# Patient Record
Sex: Female | Born: 1985 | Race: Black or African American | Hispanic: No | Marital: Single | Smoking: Current every day smoker
Health system: Southern US, Community
[De-identification: ages and names within clinical notes are randomized; demographics above are authoritative.]

## PROBLEM LIST (undated history)

## (undated) HISTORY — PX: ABDOMINAL HYSTERECTOMY: SHX81

---
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
## 2005-08-21 ENCOUNTER — Emergency Department (HOSPITAL_COMMUNITY): Admission: EM | Admit: 2005-08-21 | Discharge: 2005-08-21 | Payer: Self-pay | Admitting: Emergency Medicine

## 2005-09-16 ENCOUNTER — Emergency Department (HOSPITAL_COMMUNITY): Admission: EM | Admit: 2005-09-16 | Discharge: 2005-09-16 | Payer: Self-pay | Admitting: Emergency Medicine

## 2006-02-15 ENCOUNTER — Emergency Department (HOSPITAL_COMMUNITY): Admission: EM | Admit: 2006-02-15 | Discharge: 2006-02-16 | Payer: Self-pay | Admitting: Emergency Medicine

## 2006-06-27 ENCOUNTER — Encounter (INDEPENDENT_AMBULATORY_CARE_PROVIDER_SITE_OTHER): Payer: Self-pay | Admitting: Gynecology

## 2006-06-27 ENCOUNTER — Inpatient Hospital Stay (HOSPITAL_COMMUNITY): Admission: AD | Admit: 2006-06-27 | Discharge: 2006-06-30 | Payer: Self-pay | Admitting: Gynecology

## 2006-06-27 ENCOUNTER — Ambulatory Visit: Payer: Self-pay | Admitting: Gynecology

## 2006-07-31 ENCOUNTER — Inpatient Hospital Stay (HOSPITAL_COMMUNITY): Admission: AD | Admit: 2006-07-31 | Discharge: 2006-08-01 | Payer: Self-pay | Admitting: Obstetrics & Gynecology

## 2006-07-31 ENCOUNTER — Ambulatory Visit: Payer: Self-pay | Admitting: Obstetrics and Gynecology

## 2010-03-23 ENCOUNTER — Emergency Department (HOSPITAL_COMMUNITY)
Admission: EM | Admit: 2010-03-23 | Discharge: 2010-03-23 | Disposition: A | Discharge status: Home / Self Care | Payer: Self-pay | Attending: Emergency Medicine | Admitting: Emergency Medicine

## 2010-03-23 ENCOUNTER — Emergency Department (HOSPITAL_COMMUNITY): Payer: Self-pay

## 2010-03-23 DIAGNOSIS — R0789 Other chest pain: Secondary | ICD-10-CM | POA: Insufficient documentation

## 2010-03-23 LAB — APTT: aPTT: 31 seconds (ref 24–37)

## 2010-03-23 LAB — DIFFERENTIAL
Basophils Relative: 0 % (ref 0–1)
Eosinophils Absolute: 0.1 10*3/uL (ref 0.0–0.7)
Neutrophils Relative %: 72 % (ref 43–77)

## 2010-03-23 LAB — CBC
MCH: 29.3 pg (ref 26.0–34.0)
Platelets: 256 10*3/uL (ref 150–400)
RBC: 3.72 MIL/uL — ABNORMAL LOW (ref 3.87–5.11)
WBC: 5.7 10*3/uL (ref 4.0–10.5)

## 2010-03-23 LAB — POCT CARDIAC MARKERS
CKMB, poc: <1 ng/mL — ABNORMAL LOW (ref 1.0–8.0)
Myoglobin, poc: 58.1 ng/mL (ref 12–200)

## 2010-03-23 LAB — PROTIME-INR: Prothrombin Time: 13 seconds (ref 11.6–15.2)

## 2010-03-23 LAB — D-DIMER, QUANTITATIVE: D-Dimer, Quant: 3.21 ug/mL-FEU — ABNORMAL HIGH (ref 0.00–0.48)

## 2010-03-23 LAB — BASIC METABOLIC PANEL
CO2: 24 mEq/L (ref 19–32)
Calcium: 8.8 mg/dL (ref 8.4–10.5)
Creatinine, Ser: 0.56 mg/dL (ref 0.4–1.2)
GFR calc Af Amer: >60 mL/min (ref >60)

## 2010-03-23 MED ORDER — IOHEXOL 350 MG/ML SOLN
60.0000 mL | Freq: Once | INTRAVENOUS | Status: AC | PRN
  Administered 2010-03-23: 60 mL via INTRAVENOUS

## 2010-06-04 NOTE — Discharge Summary (Signed)
Sara Mckinney, Sara Mckinney              ACCOUNT NO.:  [id number]   MEDICAL RECORD NO.:  [id number]          PATIENT TYPE:  INP   LOCATION:  9303                          FACILITY:  [facility]   PHYSICIAN:  [name], M.D. DATE OF BIRTH:  [birth year]   DATE OF ADMISSION:  07/31/2006  DATE OF DISCHARGE:  07/12/2008LH                               DISCHARGE SUMMARY   ADMISSION DIAGNOSES:  Right upper quadrant pain, epigastric pain, rule  out biliary colic.   DISCHARGE DIAGNOSES:  Right upper quadrant pain, epigastric pain,  resolved. No evidence of biliary colic. Presumed gastroenteritis,  improved.   HISTORY OF PRESENT ILLNESS:  This 25 year old Gravida 3, para 2, status  post cesarean and hysterectomy 1 month ago was admitted with dramatic  presentation of acute vomiting beginning at 4:00 p.m. on July 31, 2006  with rising discomfort and bilious vomiting. There was question of a  positive Murphy's sign and due to severity of her pain, she was admitted  for analgesics and had evaluation. Temperature is 98.3. Overnight, the  patient improved on IV antibiotic Ancef plus fluid hydration. The  following day, gallbladder ultrasound was negative and the patient felt  tremendously better with only mild to minimal right upper quadrant pain.  Therefore, discharged the patient home on routine followup with  physician of her choice.      [name], M.D.  Electronically Signed     JVF/MEDQ  D:  08/01/2006  T:  08/02/2006  Job:  [id number]

## 2010-06-04 NOTE — Discharge Summary (Signed)
NAMESABRE, Mckinney              ACCOUNT NO.:  [id number]   MEDICAL RECORD NO.:  [id number]          PATIENT TYPE:  INP   LOCATION:  9315                          FACILITY:  [facility]   PHYSICIAN:  Ginger Carne, MD  DATE OF BIRTH:  [birth year]   DATE OF ADMISSION:  06/27/2006  DATE OF DISCHARGE:  06/30/2006                               DISCHARGE SUMMARY   ADMISSION DIAGNOSIS:  Approximately 29 weeks intrauterine pregnancy,  abruptio placenta.  No prenatal care, hypertension.   DISCHARGE DIAGNOSIS:  Primary low transverse C-section with a complete  abdominal hysterectomy, no prenatal care, 29-week viable female baby,  abruptio placenta, acute blood loss anemia requiring transfusion of 4  units, probable chronic hypertension with superimposed preeclampsia  versus hypertension due to cocaine abuse.   HISTORY:  The patient is a 25 year old G3, P 1-0-1-1 who presented with  estimated gestational age of approximately 36 weeks by LMP and no  prenatal care with vaginal bleeding for the past 2 hours prior to  admission which was described as menstrual like with clots. She was  having mild contractions every 3 minutes. These were worsening. She  denied continuous abdominal pain, she denied any known pregnancy  complications. Pregnancy history is significant for a term vaginal  delivery without complications and SAB that was early with D&C.   PAST MEDICAL HISTORY:  Noncontributory.   SOCIAL HISTORY:  On admission she denied smoking, drinking and substance  abuse. She worked as a Engineer, water.   ALLERGIES:  CODEINE causes hives.   MEDICATIONS:  Were only NyQuil.   REVIEW OF SYSTEMS:  Was negative other than the vaginal bleeding. She  denied leakage of fluid and did have fetal movement.   ADMISSION LABS:  Prenatal panel was significant for Rh positive blood  and rubella immune and an initial hemoglobin of 12.  Her glucose was  elevated at 157, however this value was after she  had received some IV  fluids. Follow-up glucose was 132 and her DIC labs were significant for  a fibrinogen that was fairly low at 324. Her UDS was negative for  cocaine but positive for opiates. She denied headache, visual  disturbance or abdominal pain.   ADMISSION PHYSICAL EXAM:  VITAL SIGNS:  Significant for BP in the  140/106 range, her highest noted was 169/119. Her bedside ultrasound by  Dr. Mia Creek revealed BPP of 2 out of 10 and an approximate gestational  age of [redacted] weeks. Radiology department also did do an ultrasound  confirming the abruption. O2 sat was normal.  She was afebrile.  GENERAL:  She was well-developed and well-nourished in no acute  distress. Normocephalic.  ENT: Normal.  LUNGS: Clear without adventitious sounds.  HEART:  RRR without murmur.  ABDOMEN:  Soft, nontender and tender and gravid.  PELVIC:  Her cervical exam was 4 cm dilated, 80%, -1, cephalic.  EXTREMITIES: Without edema.   HOSPITAL COURSE:  Due to the heavy bleeding with diagnosis of placental  abruption as well as ultrasound that was nonreassuring for fetal well-  being.  The patient went right to surgery where she  had a primary LTCS  and had intractable bleeding which required a hysterectomy. She had  spinal anesthesia.  The EBL was 1500.  She did also have a complication  of uterine atony. The placenta was noted to be fundal, there was one  nuchal cord.  The uterus had significant amount of clots.  Postoperatively the patient received 4 units of PRBC and this increased  her hematocrit from 7.7 to 13.1.  Post transfusion she did well with her  blood pressures remaining in the 140 over 90s to 100s range, there was  isolated 160/112. On day of discharge BP is were 121-141 over 89-96. Her  infant was doing well in NICU.  Her vaginal bleeding had been tapering,  she had no or orthostatic symptoms.  She had minimal abdominal  tenderness and pain.  No preeclampsia symptoms. Her exam was normal  other  than few crackles at right lower base. The incision was clean, dry  and intact with staples. She had no hyperreflexia or dependent edema. On  postoperative day #3 she was discharged home with staple removal, Steri-  Strips applied.   DISCHARGE MEDICATIONS:  1. Prenatal vitamin one a day.  2. Labetalol 400 mg one b.i.d.  3. Percocet 5/325 1-2 every 4-6 hours as needed.  She was given 40      with no refills.  4. She also given Motrin 600 one p.o. q.6 hours for pain.  5. Colace 100 mg one b.i.d. p.r.n.   She was to have lactation consult and was pumping breasts well at time  of discharge. She also did have a social service consult and they would  be following the preterm baby. She was given an appointment for follow-  up and blood pressure check on June 26, at GYN clinic and discharged  home in good condition.      Sara Mckinney, C.N.M.      Ginger Carne, MD  Electronically Signed    DP/MEDQ  D:  06/30/2006  T:  06/30/2006  Job:  [id number]

## 2010-06-04 NOTE — H&P (Signed)
Sara Mckinney, Sara Mckinney              ACCOUNT NO.:  [id number]   MEDICAL RECORD NO.:  [id number]          PATIENT TYPE:  INP   LOCATION:  9303                          FACILITY:  [facility]   PHYSICIAN:  [name], M.D. DATE OF BIRTH:  02/23/1985   DATE OF ADMISSION:  07/31/2006  DATE OF DISCHARGE:  08/01/2006                              HISTORY & PHYSICAL   ADMITTING DIAGNOSES:  1. Acute abdominal pain x6 hours, rule out biliary colic.  2. Postoperative 4 weeks status post cesarean hysterectomy.   HISTORY OF PRESENT ILLNESS:  This 25 year old female gravida 2, para 2,  now 4 weeks status post cesarean hysterectomy presents with a 4 to 6-  hour exacerbation  of acute epigastric abdominal pain and right upper  quadrant pain associated with vomiting which is persistent without any  associated fevers or chills.  The patient is writhing in bed without the  ability to obtain a comfortable position with bilious vomiting noted.  She has been alert, oriented x3 and in overall good health since  cesarean hysterectomy.   PAST MEDICAL HISTORY:  Negative except for currently hypertensive being  treated.   SURGICAL HISTORY:  1. D&C in the past for a miscarriage.  2. C-section x1 with an associated hysterectomy secondary to uterine      atony.   ALLERGIES:  CODEINE.   Cigarettes, alcohol, recreational drugs:  Denied.   PHYSICAL EXAMINATION:  GENERAL:  Shows a healthy-appearing African  American female in moderate to marked discomfort writhing in bed,  rocking back and forth and rubbing her abdomen, not able to find a  comfortable position.  Alert, oriented x3.  EYES:  Pupils are equal, round, and reactive.  NECK:  Supple.  CARDIOVASCULAR:  Normal.  LUNGS:  Clear.  ABDOMEN:  Bowel sounds hypoactive.  Scaphoid abdomen.  Upper abdomen is  tender in the epigastric area and the right upper quadrant.  Eulah Pont sign  is equivocal and interpreted as positive with exam limited by the  patient's  inability to be still.  EXTERNAL GENITALIA:  Normal.  Vaginal exam deferred at this time.   LABORATORY:  Pending urinalysis, flat and upright abdomen, CBC, liver  panel.   PLAN:  Admit for pain management.  Gallbladder ultrasound in the a.m.  and probable surgical consult or transfer depending on results of  gallbladder ultrasound.  NPO status.  IV fluid hydration and IV  analgesics.      [name], M.D.  Electronically Signed     JVF/MEDQ  D:  07/31/2006  T:  08/02/2006  Job:  [id number]   cc:   [facility] G[facility]

## 2010-06-04 NOTE — Op Note (Signed)
NAMESHARMEL, BALLANTINE              ACCOUNT NO.:  [id number]   MEDICAL RECORD NO.:  [id number]          PATIENT TYPE:  MAT   LOCATION:  MATC                          [facility]ACILITY:  [facility]   PHYSICIAN:  Ginger Carne, MD  DATE OF BIRTH:  [birth year]   DATE OF PROCEDURE:  06/27/2006  DATE OF DISCHARGE:                               OPERATIVE REPORT   PREOPERATIVE DIAGNOSIS:  29-week abruptio placenta with heavy vaginal  bleeding.   POSTOPERATIVE DIAGNOSIS:  29-week abruptio placenta with heavy vaginal  bleeding, preterm viable delivery of female infant.   PROCEDURE:  Primary cesarean hysterectomy.   SURGEON:  Ginger Carne, MD   ASSISTANT:  None.   COMPLICATIONS:  None immediate.   ESTIMATED BLOOD LOSS:  1500 mL.   ANESTHESIA:  Spinal.   SPECIMEN:  Cord pH (6.96) cord bloods and placenta to pathology.   OPERATIVE FINDINGS:  A female infant in vertex presentation, 3+ clots in  the uterine cavity 75% abruptio on maternal side evidenced by attached  clots to said surface.  Anterofundal placenta.  Nuchal cord times one.  No gross abnormalities.  The baby cried spontaneously at delivery.  Placenta was complete, three-vessel cord central insertion.  Uterus,  tubes and ovaries showed normal decidual changes of pregnancy.  NICU  present.   OPERATIVE PROCEDURE:  The patient prepped and draped in usual fashion  and placed in the left lateral supine position.  Betadine solution used  for antiseptic.  The patient catheterized prior to procedure.  After  adequate spinal analgesia, a Pfannenstiel incision was made.  The  abdomen opened.  Lower uterine segment incised transversely.  Baby  delivered, cord clamped and cut and infant given to the pediatric staff  after bulb suctioning.  Placenta removed manually.  Uterus inspected.  Closure of the uterus in one layer of 0-0 Vicryl running interlocking  suture.   Uterine atony had occurred following said closure of uterine scar.  Vigorous  massage with 40 units of Pitocin per liter were placed in the  intravenous solution and run at a full open stream.  The patient was  hypertensive and not a candidate for Methergine.  Hemabate 250 mcg  injected in the fundus of the uterus and massaged.  At that time an  emergency was called into another room and our circulating nurse had to  leave.  This prevented the immediate access to either Cytotec and/or  Keith needle with appropriate sutures to perform a meatloaf circular  suturing of the uterus proper.  For this reason a cesarean hysterectomy  was performed.  The patient had also indicated she had no desire for  further child bearing.   The round ligaments were clamped, cut and ligated with 0-0 Vicryl suture  and divided.  Afterwards the utero-ovarian ligaments were clamped and  ligated with 0-0 Vicryl suture.  The anterior posterior peritoneal  reflections were dissected off the uterus and in a standard Richardson  fashion, the uterine vasculature was clamped and ligated with 0-0 Vicryl  suture.  At the junction of cervix and vagina, the corpus and cervix  were removed,  cuff closed with 0-0 Vicryl running interrupted sutures.  Bleeding points hemostatically checked.  Blood clots removed.  Closure  of the fascia in one layer of 0-0 Vicryl running suture and skin staples  for the skin.  Instrument and sponge count were correct.  The patient  tolerated the procedure well, returned to post anesthesia recovery room  in excellent condition.      Ginger Carne, MD  Electronically Signed     SHB/MEDQ  D:  06/27/2006  T:  06/27/2006  Job:  [id number]

## 2010-06-07 NOTE — Op Note (Signed)
   NAME:  Sara Mckinney, Sara Mckinney                        ACCOUNT NO.:  [id number]   MEDICAL RECORD NO.:  [id number]                   PATIENT TYPE:  MAT   LOCATION:  MATC                                 [facility]ACILITY:  [facility]   PHYSICIAN:  Charles A. Clearance Coots, M.D.             DATE OF BIRTH:  08/19/1985   DATE OF PROCEDURE:  01/07/2002  DATE OF DISCHARGE:                                 OPERATIVE REPORT   PREOPERATIVE DIAGNOSES:  Incomplete spontaneous abortion, first trimester.   POSTOPERATIVE DIAGNOSES:  Incomplete spontaneous abortion, first trimester.   PROCEDURE:  Suction evacuation.   SURGEON:  Charles A. Clearance Coots, M.D.   ANESTHESIA:  MAC with paracervical block.   ESTIMATED BLOOD LOSS:  100 mL.   COMPLICATIONS:  None.   SPECIMENS:  Products of conception.   OPERATION:  The patient was brought to the operating room, and after  satisfactory IV sedation, the legs were brought up in stirrups, and the  vagina was prepped and draped in the usual sterile fashion.  The urinary  bladder was emptied of approximately 100 mL of clear urine.  Bimanual  examination revealed the uterus to be in midposition, approximately 6-8  weeks' size.  A sterile speculum was inserted in the vaginal vault, and the  cervix was isolated.  The anterior lip of the cervix was grasped with a  double-toothed tenaculum.  A paracervical block of approximately 20 mL total  of 2% Xylocaine mixed with 2 mL of sodium bicarbonate was injected in each  lateral fornix, approximately 10 mL in each lateral fornix.  The cervix was  noted to be open, and products of conception were noted in the cervical os.  A #7 suction cath was easily introduced into the uterine cavity, and all  contents were evacuated.  The uterus contracted down quite well, and the  endometrial surface felt quite gritty at the conclusion of the procedure.  There was no active bleeding noted.  All instruments were retired.  The  patient tolerated the procedure well  and was transported to the recovery  room in satisfactory condition.                                               Charles A. Clearance Coots, M.D.    CAH/MEDQ  D:  01/07/2002  T:  01/08/2002  Job:  [id number]

## 2010-06-22 ENCOUNTER — Emergency Department (HOSPITAL_COMMUNITY)
Admission: EM | Admit: 2010-06-22 | Discharge: 2010-06-23 | Disposition: A | Discharge status: Home / Self Care | Payer: Self-pay | Attending: Emergency Medicine | Admitting: Emergency Medicine

## 2010-06-22 DIAGNOSIS — R079 Chest pain, unspecified: Secondary | ICD-10-CM | POA: Insufficient documentation

## 2010-06-22 DIAGNOSIS — R0989 Other specified symptoms and signs involving the circulatory and respiratory systems: Secondary | ICD-10-CM | POA: Insufficient documentation

## 2010-06-22 DIAGNOSIS — N390 Urinary tract infection, site not specified: Secondary | ICD-10-CM | POA: Insufficient documentation

## 2010-06-22 DIAGNOSIS — D649 Anemia, unspecified: Secondary | ICD-10-CM | POA: Insufficient documentation

## 2010-06-22 DIAGNOSIS — R0609 Other forms of dyspnea: Secondary | ICD-10-CM | POA: Insufficient documentation

## 2010-06-22 DIAGNOSIS — E876 Hypokalemia: Secondary | ICD-10-CM | POA: Insufficient documentation

## 2010-06-23 ENCOUNTER — Emergency Department (HOSPITAL_COMMUNITY): Payer: Self-pay

## 2010-06-23 LAB — DIFFERENTIAL
Basophils Absolute: 0 10*3/uL (ref 0.0–0.1)
Basophils Relative: 0 % (ref 0–1)
Eosinophils Absolute: 0.1 10*3/uL (ref 0.0–0.7)
Eosinophils Relative: 1 % (ref 0–5)
Lymphocytes Relative: 18 % (ref 12–46)

## 2010-06-23 LAB — URINALYSIS, ROUTINE W REFLEX MICROSCOPIC
Ketones, ur: >80 mg/dL — AB
Nitrite: POSITIVE — AB
Protein, ur: NEGATIVE mg/dL

## 2010-06-23 LAB — CBC
Platelets: 326 10*3/uL (ref 150–400)
RDW: 12.7 % (ref 11.5–15.5)
WBC: 8.2 10*3/uL (ref 4.0–10.5)

## 2010-06-23 LAB — POCT PREGNANCY, URINE: Preg Test, Ur: NEGATIVE

## 2010-06-23 LAB — RAPID URINE DRUG SCREEN, HOSP PERFORMED
Amphetamines: NOT DETECTED
Barbiturates: NOT DETECTED
Cocaine: NOT DETECTED
Opiates: NOT DETECTED
Tetrahydrocannabinol: POSITIVE — AB

## 2010-06-23 LAB — POCT I-STAT, CHEM 8
Calcium, Ion: 1.11 mmol/L — ABNORMAL LOW (ref 1.12–1.32)
Glucose, Bld: 84 mg/dL (ref 70–99)
HCT: 29 % — ABNORMAL LOW (ref 36.0–46.0)
Hemoglobin: 9.9 g/dL — ABNORMAL LOW (ref 12.0–15.0)
Potassium: 3.2 mEq/L — ABNORMAL LOW (ref 3.5–5.1)
TCO2: 22 mmol/L (ref 0–100)

## 2010-06-23 LAB — OCCULT BLOOD, POC DEVICE: Fecal Occult Bld: NEGATIVE

## 2010-11-05 LAB — URINE MICROSCOPIC-ADD ON: RBC / HPF: NONE SEEN

## 2010-11-05 LAB — COMPREHENSIVE METABOLIC PANEL
ALT: 19
CO2: 22
Calcium: 10
Creatinine, Ser: 0.6
GFR calc Af Amer: >60
GFR calc non Af Amer: >60
Glucose, Bld: 142 — ABNORMAL HIGH
Sodium: 133 — ABNORMAL LOW
Total Protein: 9.2 — ABNORMAL HIGH

## 2010-11-05 LAB — DIFFERENTIAL
Basophils Absolute: 0
Basophils Relative: 0
Monocytes Absolute: 0.4
Neutro Abs: 9.6 — ABNORMAL HIGH

## 2010-11-05 LAB — URINALYSIS, ROUTINE W REFLEX MICROSCOPIC
Bilirubin Urine: NEGATIVE
Glucose, UA: NEGATIVE
Hgb urine dipstick: NEGATIVE
Ketones, ur: >80 — AB
Nitrite: NEGATIVE
Protein, ur: 30 — AB
Specific Gravity, Urine: 1.02
Urobilinogen, UA: 0.2
pH: 7

## 2010-11-05 LAB — HEPATIC FUNCTION PANEL
ALT: 18
AST: 21
Albumin: 4
Alkaline Phosphatase: 98
Total Bilirubin: 1.1

## 2010-11-05 LAB — CBC
Hemoglobin: 12.7
MCHC: 34.7
MCV: 88.1
RDW: 14

## 2010-11-05 LAB — HEPATITIS PANEL, ACUTE: Hepatitis B Surface Ag: NEGATIVE

## 2010-11-07 LAB — DIC (DISSEMINATED INTRAVASCULAR COAGULATION)PANEL
D-Dimer, Quant: >20 — ABNORMAL HIGH
Platelets: 208
Prothrombin Time: 12.2

## 2010-11-07 LAB — BASIC METABOLIC PANEL
BUN: 3 — ABNORMAL LOW
BUN: 4 — ABNORMAL LOW
Chloride: 103
GFR calc Af Amer: >60
GFR calc non Af Amer: >60
Potassium: 3.5
Potassium: 4.1
Sodium: 133 — ABNORMAL LOW

## 2010-11-07 LAB — TYPE AND SCREEN: ABO/RH(D): B POS

## 2010-11-07 LAB — RAPID URINE DRUG SCREEN, HOSP PERFORMED
Amphetamines: NOT DETECTED
Amphetamines: NOT DETECTED
Benzodiazepines: NOT DETECTED
Cocaine: NOT DETECTED
Opiates: POSITIVE — AB
Tetrahydrocannabinol: NOT DETECTED
Tetrahydrocannabinol: NOT DETECTED

## 2010-11-07 LAB — DIFFERENTIAL
Basophils Absolute: 0
Lymphocytes Relative: 19
Lymphs Abs: 1.5
Monocytes Absolute: 0.8 — ABNORMAL HIGH
Monocytes Relative: 11
Neutro Abs: 5.4

## 2010-11-07 LAB — CBC
HCT: 38.1
HCT: 39.6
Hemoglobin: 12
Hemoglobin: 13.1
MCV: 88.7
Platelets: 127 — ABNORMAL LOW
RBC: 3.65 — ABNORMAL LOW
RBC: 4.3
WBC: 10.7 — ABNORMAL HIGH
WBC: 7.8
WBC: 8.6

## 2010-11-07 LAB — RUBELLA SCREEN: Rubella: 221.7 — ABNORMAL HIGH

## 2010-11-07 LAB — HEMOGLOBIN AND HEMATOCRIT, BLOOD: HCT: 22.9 — ABNORMAL LOW

## 2010-11-07 LAB — ABO/RH: ABO/RH(D): B POS

## 2010-11-07 LAB — PREPARE RBC (CROSSMATCH)

## 2010-11-07 LAB — RPR: RPR Ser Ql: NONREACTIVE

## 2011-12-25 IMAGING — CR DG CHEST 2V
2 series · 2 of 2 positions shown · non-contrast
Comparison: None.

CLINICAL DATA: Chest pain

CHEST - 2 VIEW

[w chest pa]
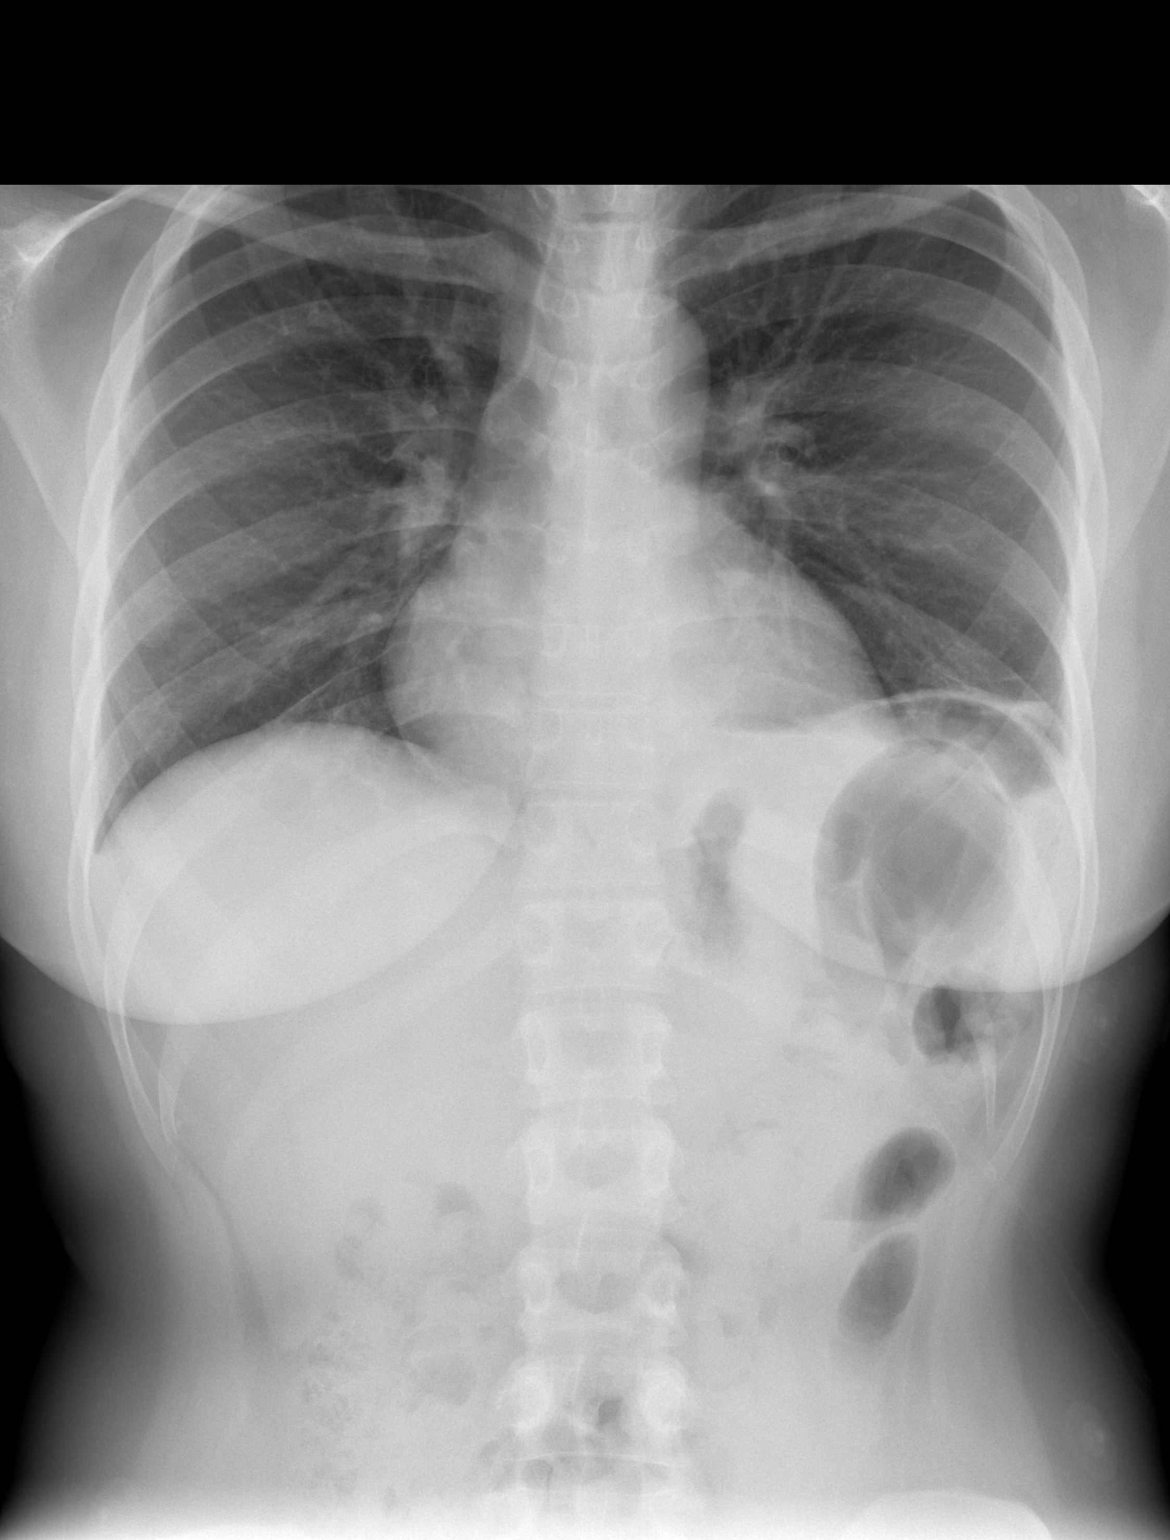

[w chest lat]
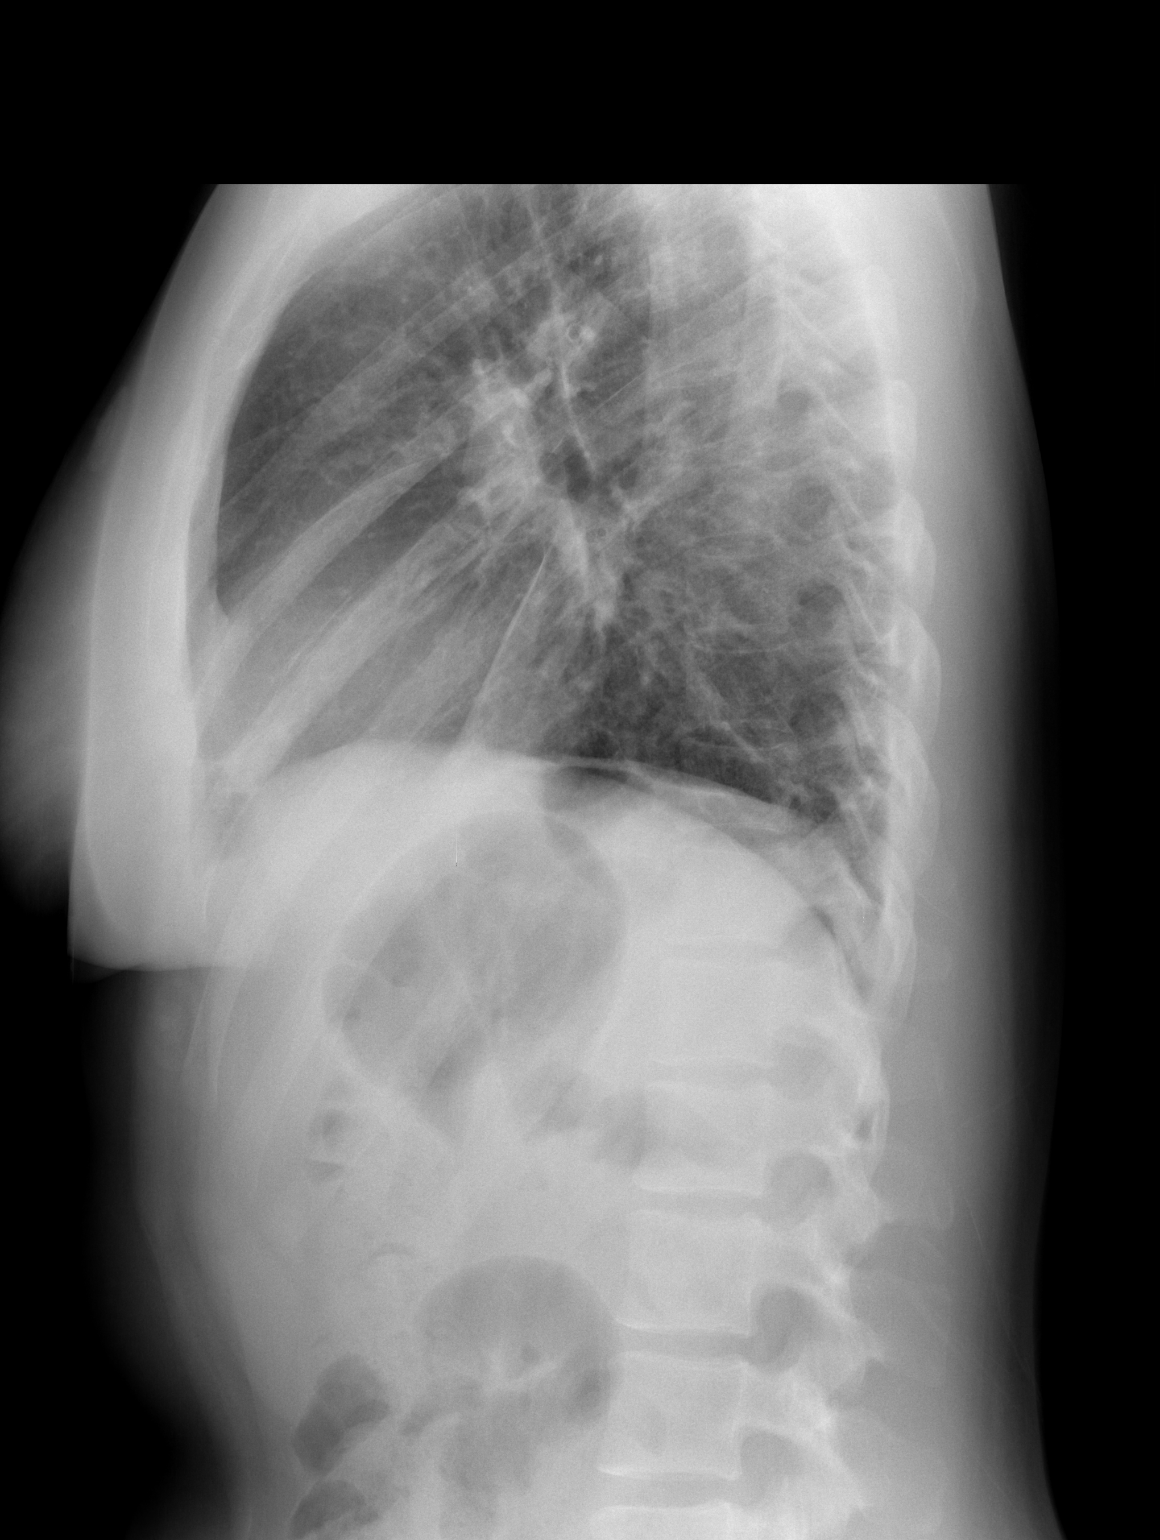

[2 of 2 positions shown; findings below may reference images not displayed]

FINDINGS: Cardiomediastinal silhouette is unremarkable.  No acute
infiltrate or pleural effusion.  No pulmonary edema.  Bilateral
basilar linear atelectasis noted.
IMPRESSION: No active disease.  Bilateral basilar linear atelectasis.

## 2012-03-26 IMAGING — CR DG CHEST 2V
3 series · 3 of 3 positions shown · non-contrast
Comparison: 03/23/2010

CLINICAL DATA: Chest pain.  Smoker.

CHEST - 2 VIEW

[w chest pa (1 of 2)]
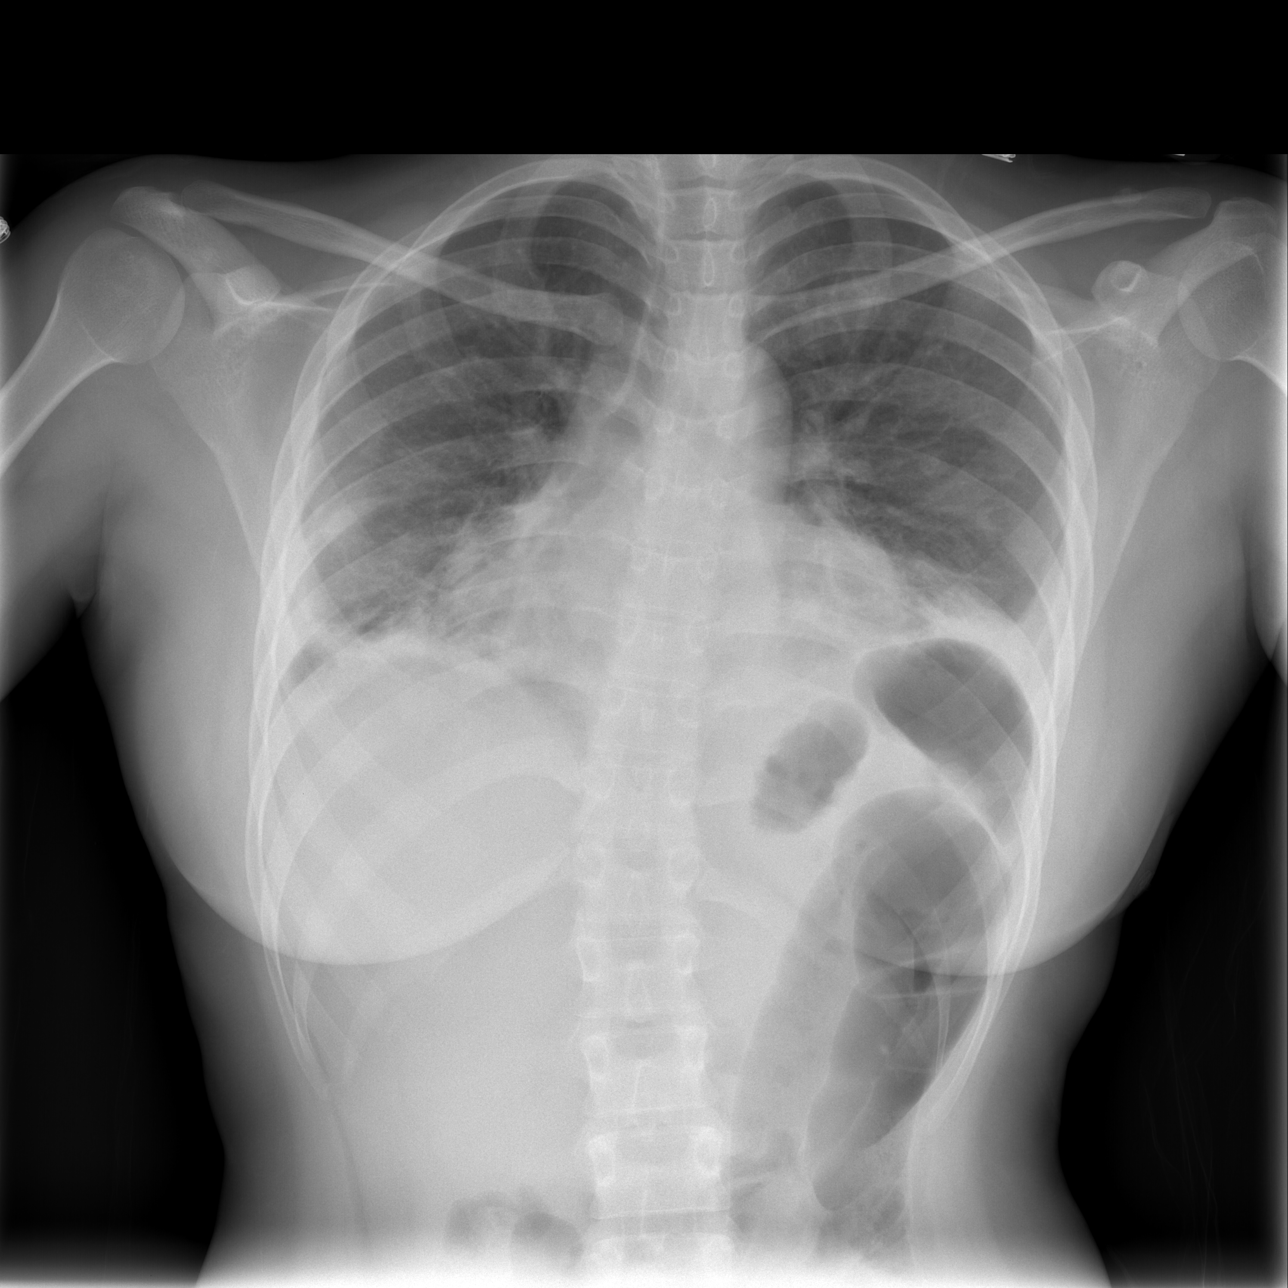

[w chest pa (2 of 2)]
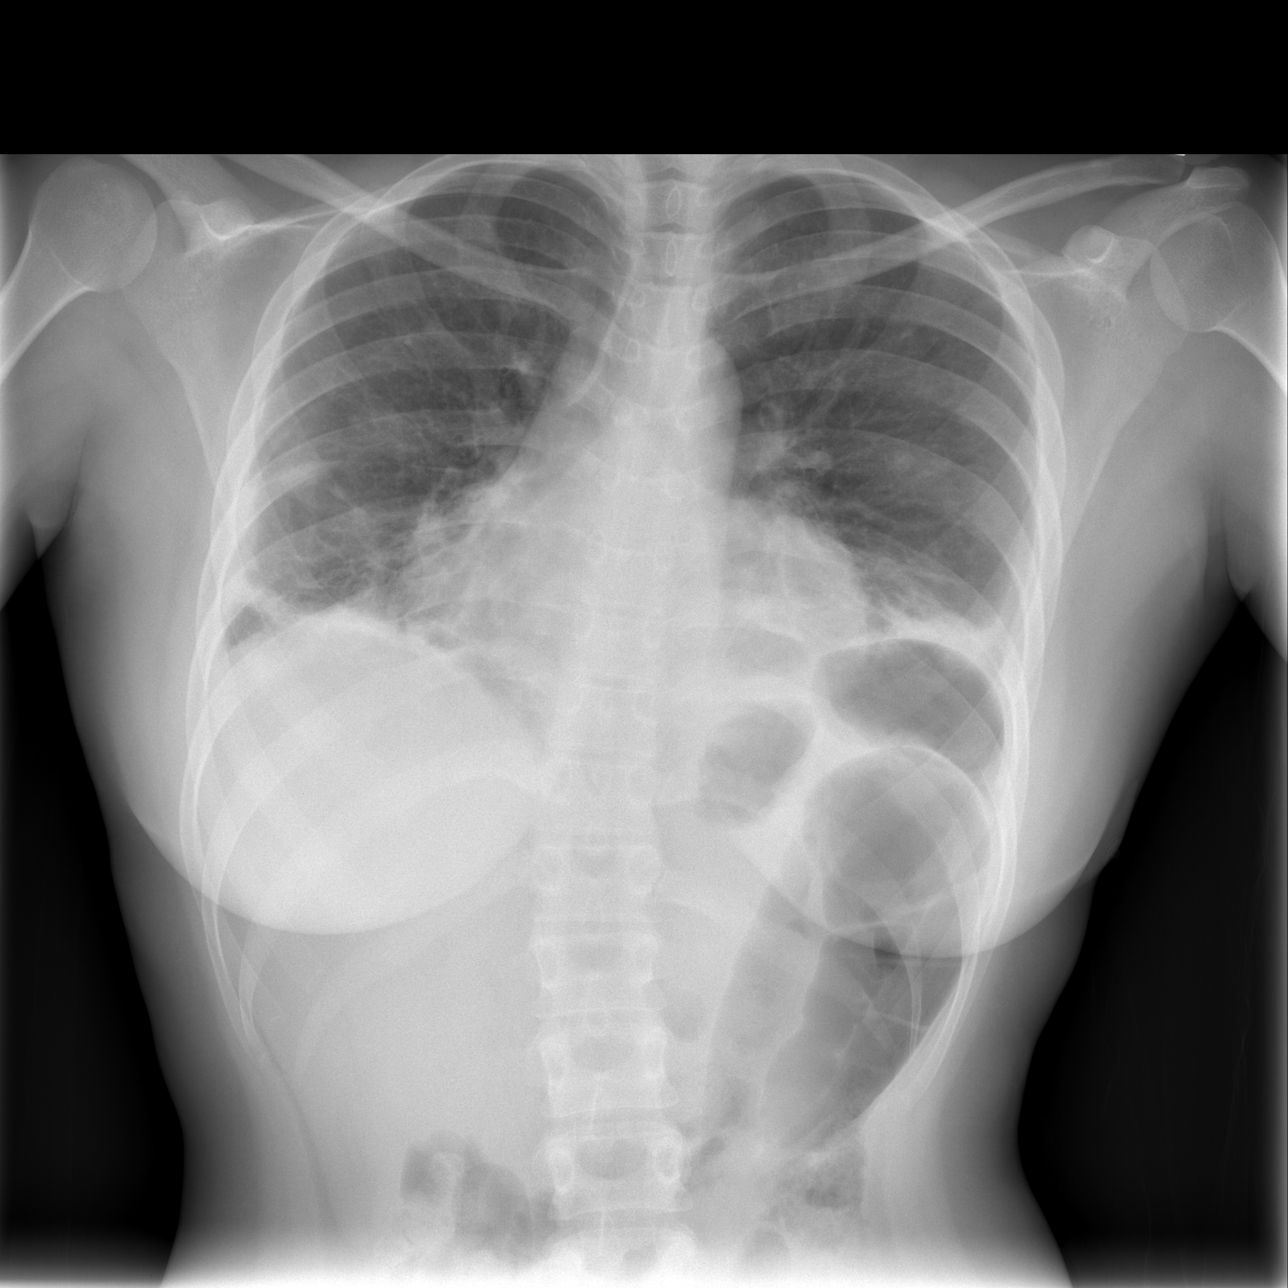

[w chest lat]
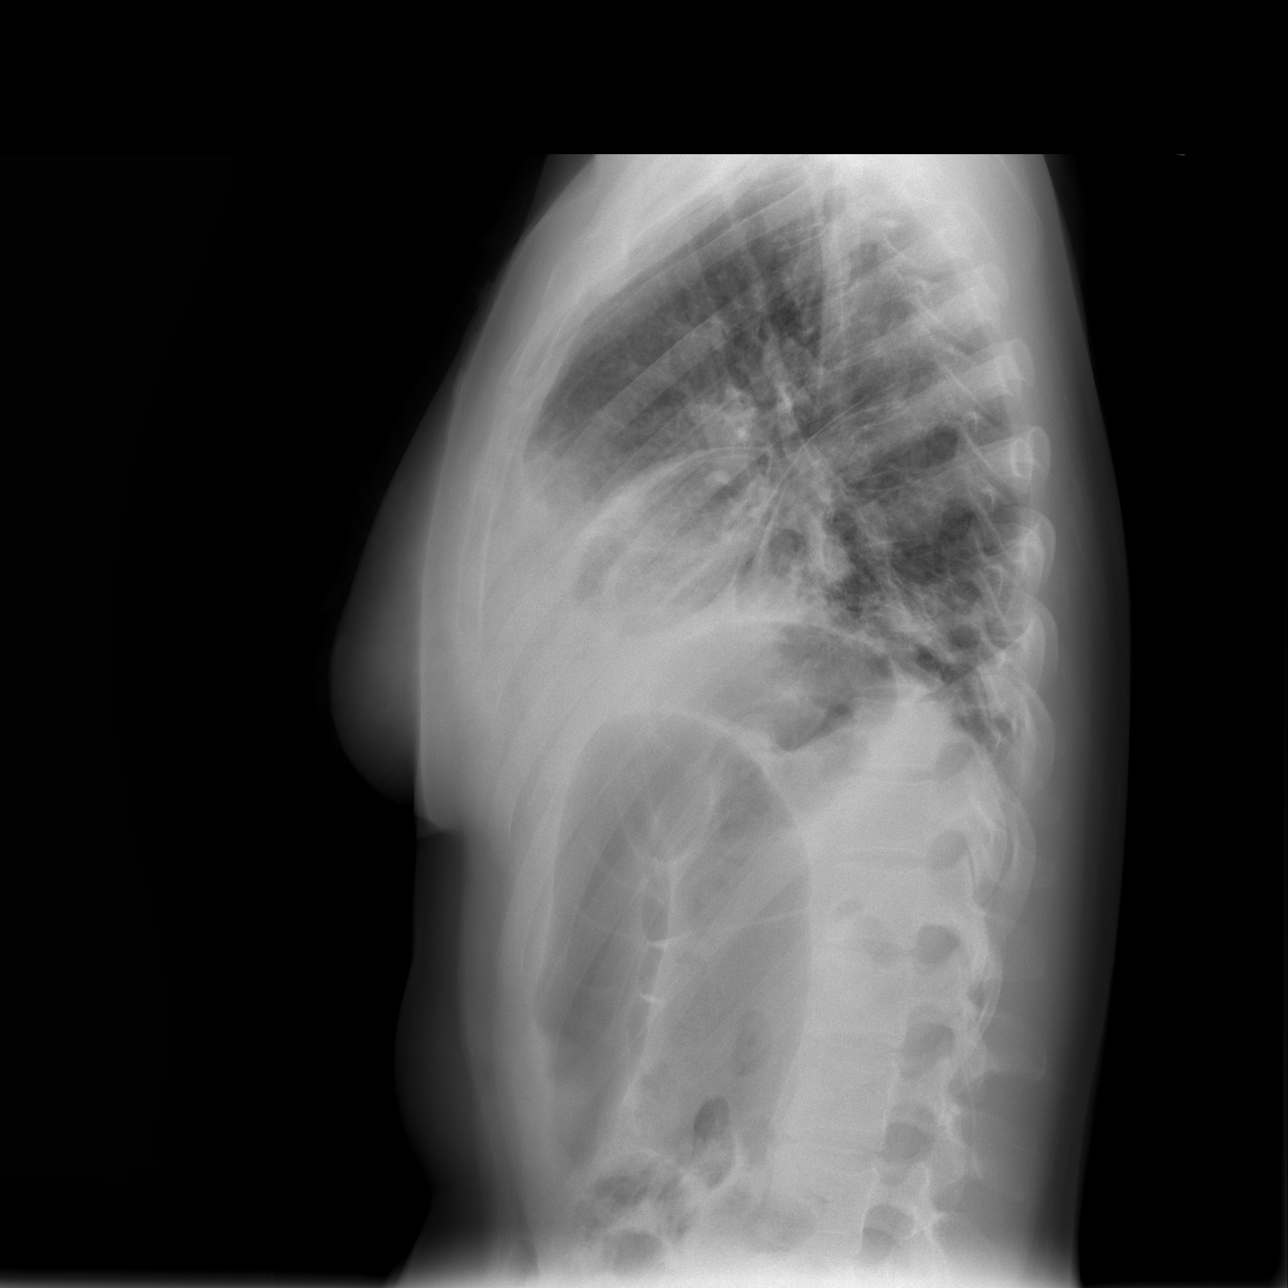

[3 of 3 positions shown; findings below may reference images not displayed]

FINDINGS: Midline trachea.  Normal heart size and mediastinal
contours.  Mild to moderate S-shaped thoracolumbar spine curvature.
No pleural effusion or pneumothorax.  Worsened bibasilar aeration.
Diminished lung volumes with increased lingular and bibasilar
atelectasis.
IMPRESSION: Progressive bibasilar airspace disease, favored to represent
atelectasis.  Similar findings within the lingula. No acute
findings.

## 2013-09-16 ENCOUNTER — Encounter (HOSPITAL_COMMUNITY): Payer: Self-pay | Admitting: Emergency Medicine

## 2013-09-16 ENCOUNTER — Emergency Department (HOSPITAL_COMMUNITY)
Admission: EM | Admit: 2013-09-16 | Discharge: 2013-09-16 | Disposition: A | Discharge status: Home / Self Care | Payer: Medicaid Other | Attending: Emergency Medicine | Admitting: Emergency Medicine

## 2013-09-16 DIAGNOSIS — B86 Scabies: Secondary | ICD-10-CM | POA: Insufficient documentation

## 2013-09-16 DIAGNOSIS — F17200 Nicotine dependence, unspecified, uncomplicated: Secondary | ICD-10-CM | POA: Insufficient documentation

## 2013-09-16 DIAGNOSIS — R21 Rash and other nonspecific skin eruption: Secondary | ICD-10-CM | POA: Insufficient documentation

## 2013-09-16 MED ORDER — PERMETHRIN 5 % EX CREA: TOPICAL_CREAM | CUTANEOUS | Status: DC

## 2013-09-16 NOTE — ED Provider Notes (Signed)
CSN: [id number]     Arrival date & time 09/16/13  2053 History   First MD Initiated Contact with Patient 09/16/13 2055     Chief Complaint  Patient presents with  . Rash     (Consider location/radiation/quality/duration/timing/severity/associated sxs/prior Treatment) Patient is a 28 y.o. female presenting with rash. The history is provided by the patient.  Rash Location: arms and legs and hands. Quality: itchiness and redness   Severity:  Moderate Onset quality:  Gradual Duration:  4 weeks Timing:  Constant Progression:  Spreading Chronicity:  New Context: sick contacts   Context: not animal contact   Relieved by:  Nothing Worsened by:  Nothing tried Ineffective treatments:  None tried Associated symptoms: no abdominal pain, no diarrhea, no fever, no induration, no joint pain, no periorbital edema, no shortness of breath, no sore throat, no throat swelling, no tongue swelling, not vomiting and not wheezing     History reviewed. No pertinent past medical history. Past Surgical History  Procedure Laterality Date  . Cesarean section     No family history on file. History  Substance Use Topics  . Smoking status: Current Every Day Smoker  . Smokeless tobacco: Not on file  . Alcohol Use: Not on file   OB History   Grav Para Term Preterm Abortions TAB SAB Ect Mult Living                 Review of Systems  Constitutional: Negative for fever.  HENT: Negative for sore throat.   Respiratory: Negative for shortness of breath and wheezing.   Gastrointestinal: Negative for vomiting, abdominal pain and diarrhea.  Musculoskeletal: Negative for arthralgias.  Skin: Positive for rash.  All other systems reviewed and are negative.     Allergies  Codeine  Home Medications   Prior to Admission medications   Medication Sig Start Date End Date Taking? Authorizing Provider  permethrin (ELIMITE) 5 % cream Apply entire body from neck to toes and leave on for 8-10 days then wash  off.  Repeat in 7 days qs 09/16/13   Arley Phenix, MD   BP 135/91  Pulse 88  Temp(Src) 98.1 F (36.7 C) (Oral)  Resp 24  Wt 150 lb (68.04 kg)  SpO2 100% Physical Exam  Nursing note and vitals reviewed. Constitutional: She is oriented to person, place, and time. She appears well-developed and well-nourished.  HENT:  Head: Normocephalic.  Right Ear: External ear normal.  Left Ear: External ear normal.  Nose: Nose normal.  Mouth/Throat: Oropharynx is clear and moist.  Eyes: EOM are normal. Pupils are equal, round, and reactive to light. Right eye exhibits no discharge. Left eye exhibits no discharge.  Neck: Normal range of motion. Neck supple. No tracheal deviation present.  No nuchal rigidity no meningeal signs  Cardiovascular: Normal rate and regular rhythm.   Pulmonary/Chest: Effort normal and breath sounds normal. No stridor. No respiratory distress. She has no wheezes. She has no rales.  Abdominal: Soft. She exhibits no distension and no mass. There is no tenderness. There is no rebound and no guarding.  Musculoskeletal: Normal range of motion. She exhibits no edema and no tenderness.  Neurological: She is alert and oriented to person, place, and time. She has normal reflexes. No cranial nerve deficit. Coordination normal.  Skin: Skin is warm. Rash noted. She is not diaphoretic. No erythema. No pallor.  No pettechia no purpura.  Multiple macules located on forearms ankles and webspaces of fingers and toes with multiple burrow marks  no induration fluctuance or tenderness no spreading erythema    ED Course  Procedures (including critical care time) Labs Review Labs Reviewed - No data to display  Imaging Review No results found.   EKG Interpretation None      MDM   Final diagnoses:  Scabies    I have reviewed the patient's past medical records and nursing notes and used this information in my decision-making process.  Scabies clinically on exam. No petechiae no  purpura. Patient is nontoxic. We'll start patient on permethrin cream and discharge home pt agrees with plan    Arley Phenix, MD 09/16/13 2129

## 2013-09-16 NOTE — Discharge Instructions (Signed)

## 2013-09-16 NOTE — ED Notes (Signed)
Pt states that she began itching on forearms about 1 month ago and since then has noticed bumps and blisters on hands that are itchy.

## 2015-03-10 ENCOUNTER — Emergency Department (HOSPITAL_COMMUNITY)
Admission: EM | Admit: 2015-03-10 | Discharge: 2015-03-10 | Disposition: A | Discharge status: Home / Self Care | Payer: Medicaid Other | Attending: Emergency Medicine | Admitting: Emergency Medicine

## 2015-03-10 ENCOUNTER — Encounter (HOSPITAL_COMMUNITY): Payer: Self-pay | Admitting: Emergency Medicine

## 2015-03-10 DIAGNOSIS — Y998 Other external cause status: Secondary | ICD-10-CM | POA: Insufficient documentation

## 2015-03-10 DIAGNOSIS — W101XXA Fall (on)(from) sidewalk curb, initial encounter: Secondary | ICD-10-CM | POA: Diagnosis not present

## 2015-03-10 DIAGNOSIS — S01511A Laceration without foreign body of lip, initial encounter: Secondary | ICD-10-CM | POA: Diagnosis present

## 2015-03-10 DIAGNOSIS — W19XXXA Unspecified fall, initial encounter: Secondary | ICD-10-CM

## 2015-03-10 DIAGNOSIS — S60511A Abrasion of right hand, initial encounter: Secondary | ICD-10-CM | POA: Insufficient documentation

## 2015-03-10 DIAGNOSIS — F17200 Nicotine dependence, unspecified, uncomplicated: Secondary | ICD-10-CM | POA: Insufficient documentation

## 2015-03-10 DIAGNOSIS — Y9301 Activity, walking, marching and hiking: Secondary | ICD-10-CM | POA: Insufficient documentation

## 2015-03-10 DIAGNOSIS — Y9289 Other specified places as the place of occurrence of the external cause: Secondary | ICD-10-CM | POA: Diagnosis not present

## 2015-03-10 NOTE — ED Provider Notes (Signed)
CSN: [id number]     Arrival date & time 03/10/15  0013 History   First MD Initiated Contact with Patient 03/10/15 0045     Chief Complaint  Patient presents with  . Fall   HPI   30 year old female presents status post fall. Patient reports she was walking tripped on a curb fell landing forward landing on her right hand and hitting her shin and forehead. Patient denies any loss of consciousness, headache, neck pain. She reports small laceration to the inside of her lip, no damage to teeth or mouth, full active range of motion of the jaw. Pain to the right palm with a small superficial laceration. No pain to the wrist or hand. No medications prior to arrival.   History reviewed. No pertinent past medical history. Past Surgical History  Procedure Laterality Date  . Cesarean section     No family history on file. Social History  Substance Use Topics  . Smoking status: Current Every Day Smoker  . Smokeless tobacco: None  . Alcohol Use: Yes   OB History    No data available     Review of Systems  All other systems reviewed and are negative.   Allergies  Codeine  Home Medications   Prior to Admission medications   Medication Sig Start Date End Date Taking? Authorizing Provider  acetaminophen (TYLENOL) 500 MG tablet Take 1,000 mg by mouth every 6 (six) hours as needed for mild pain.   Yes Historical Provider, MD   BP 114/87 mmHg  Pulse 98  Temp(Src) 98.9 F (37.2 C) (Oral)  Resp 14  Ht  (1.651 m)  Wt 66.225 kg  BMI 24.30 kg/m2  SpO2 99%   Physical Exam  Constitutional: She is oriented to person, place, and time. She appears well-developed and well-nourished.  HENT:  Head: Normocephalic and atraumatic.  Superficial 0.25 cm laceration to the chin, no foreign bodies. Jaw nontender palpation full active range of motion, normal dental alignment. Oral exam shows no loose teeth, no signs of trauma.  Eyes: Conjunctivae are normal. Pupils are equal, round, and reactive to  light. Right eye exhibits no discharge. Left eye exhibits no discharge. No scleral icterus.  Neck: Normal range of motion. No JVD present. No tracheal deviation present.  Pulmonary/Chest: Effort normal. No stridor.  Musculoskeletal: Normal range of motion. She exhibits tenderness. She exhibits no edema.  No CT or L-spine tenderness full active range of motion of the neck without pain.  Superficial abrasion to the right palm, not full thickness, no pain to palpation of the bones of the hand wrist elbow or shoulder. Specifically no snuffbox tenderness  Neurological: She is alert and oriented to person, place, and time. Coordination normal.  Psychiatric: She has a normal mood and affect. Her behavior is normal. Judgment and thought content normal.  Nursing note and vitals reviewed.   ED Course  Procedures (including critical care time) Labs Review Labs Reviewed - No data to display  Imaging Review No results found. I have personally reviewed and evaluated these images and lab results as part of my medical decision-making.   EKG Interpretation None      MDM   Final diagnoses:  Fall, initial encounter  Lip laceration, initial encounter    Labs:   Imaging:  Consults:  Therapeutics:  Discharge Meds:   Assessment/Plan: 30 year old female presents today status post fall.  Patient has no loss consciousness, no headache, full active range of motion of the neck, nontender to palpation, no neurological  deficits were indications for CT head, no blood thinners. She has a laceration to the inside of her lip, this is not full thickness, 0.25 cm in length. Patient has no trauma to the teeth, superficial laceration of the hand that is not amenable to repair. She has no acute findings that would necessitate further evaluation and management here in the ED. Patient will be discharged home with symptomatic care instructions, prescription or percussion. She verbalized understanding and agreement  for today's plan and had no further questions or concerns at time of discharge.         Eyvonne Mechanic, PA-C 03/10/15 0105  Derwood Kaplan, MD 03/10/15 [phone]

## 2015-03-10 NOTE — ED Notes (Signed)
Pt. tripped and fell forward hit her head against the pavement this evening with no LOC / ambulatory , presents with lower lip swelling and small abrasion at upper forehead , alert and oriented /respirations unlabored.

## 2015-03-10 NOTE — Discharge Instructions (Signed)
Facial Laceration ° A facial laceration is a cut on the face. These injuries can be painful and cause bleeding. Lacerations usually heal quickly, but they need special care to reduce scarring. °DIAGNOSIS  °Your health care provider will take a medical history, ask for details about how the injury occurred, and examine the wound to determine how deep the cut is. °TREATMENT  °Some facial lacerations may not require closure. Others may not be able to be closed because of an increased risk of infection. The risk of infection and the chance for successful closure will depend on various factors, including the amount of time since the injury occurred. °The wound may be cleaned to help prevent infection. If closure is appropriate, pain medicines may be given if needed. Your health care provider will use stitches (sutures), wound glue (adhesive), or skin adhesive strips to repair the laceration. These tools bring the skin edges together to allow for faster healing and a better cosmetic outcome. If needed, you may also be given a tetanus shot. °HOME CARE INSTRUCTIONS °· Only take over-the-counter or prescription medicines as directed by your health care provider. °· Follow your health care provider's instructions for wound care. These instructions will vary depending on the technique used for closing the wound. °For Sutures: °· Keep the wound clean and dry.   °· If you were given a bandage (dressing), you should change it at least once a day. Also change the dressing if it becomes wet or dirty, or as directed by your health care provider.   °· Wash the wound with soap and water 2 times a day. Rinse the wound off with water to remove all soap. Pat the wound dry with a clean towel.   °· After cleaning, apply a thin layer of the antibiotic ointment recommended by your health care provider. This will help prevent infection and keep the dressing from sticking.   °· You may shower as usual after the first 24 hours. Do not soak the  wound in water until the sutures are removed.   °· Get your sutures removed as directed by your health care provider. With facial lacerations, sutures should usually be taken out after 4-5 days to avoid stitch marks.   °· Wait a few days after your sutures are removed before applying any makeup. °For Skin Adhesive Strips: °· Keep the wound clean and dry.   °· Do not get the skin adhesive strips wet. You may bathe carefully, using caution to keep the wound dry.   °· If the wound gets wet, pat it dry with a clean towel.   °· Skin adhesive strips will fall off on their own. You may trim the strips as the wound heals. Do not remove skin adhesive strips that are still stuck to the wound. They will fall off in time.   °For Wound Adhesive: °· You may briefly wet your wound in the shower or bath. Do not soak or scrub the wound. Do not swim. Avoid periods of heavy sweating until the skin adhesive has fallen off on its own. After showering or bathing, gently pat the wound dry with a clean towel.   °· Do not apply liquid medicine, cream medicine, ointment medicine, or makeup to your wound while the skin adhesive is in place. This may loosen the film before your wound is healed.   °· If a dressing is placed over the wound, be careful not to apply tape directly over the skin adhesive. This may cause the adhesive to be pulled off before the wound is healed.   °· Avoid   prolonged exposure to sunlight or tanning lamps while the skin adhesive is in place. °· The skin adhesive will usually remain in place for 5-10 days, then naturally fall off the skin. Do not pick at the adhesive film.   °After Healing: °Once the wound has healed, cover the wound with sunscreen during the day for 1 full year. This can help minimize scarring. Exposure to ultraviolet light in the first year will darken the scar. It can take 1-2 years for the scar to lose its redness and to heal completely.  °SEEK MEDICAL CARE IF: °· You have a fever. °SEEK IMMEDIATE  MEDICAL CARE IF: °· You have redness, pain, or swelling around the wound.   °· You see a yellowish-white fluid (pus) coming from the wound.   °  °This information is not intended to replace advice given to you by your health care provider. Make sure you discuss any questions you have with your health care provider. °  °Document Released: 02/14/2004 Document Revised: 01/27/2014 Document Reviewed: 08/19/2012 °Elsevier Interactive Patient Education ©2016 [employer] ° °

## 2015-03-10 NOTE — ED Notes (Signed)
Pt verbalized understanding of d/c instructions and has no further questions. Pt stable and NAD.  

## 2019-04-15 ENCOUNTER — Encounter (HOSPITAL_COMMUNITY): Payer: Self-pay

## 2019-04-15 ENCOUNTER — Other Ambulatory Visit: Payer: Self-pay

## 2019-04-15 ENCOUNTER — Ambulatory Visit (HOSPITAL_COMMUNITY)
Admission: EM | Admit: 2019-04-15 | Discharge: 2019-04-15 | Disposition: A | Discharge status: Home / Self Care | Payer: Self-pay | Attending: Physician Assistant | Admitting: Physician Assistant

## 2019-04-15 DIAGNOSIS — R202 Paresthesia of skin: Secondary | ICD-10-CM

## 2019-04-15 DIAGNOSIS — R21 Rash and other nonspecific skin eruption: Secondary | ICD-10-CM

## 2019-04-15 MED ORDER — IBUPROFEN 600 MG PO TABS: 600.0000 mg | ORAL_TABLET | Freq: Four times a day (QID) | ORAL | 0 refills | Status: AC | PRN

## 2019-04-15 MED ORDER — BACITRACIN-NEOMYCIN-POLYMYXIN 400-5-5000 EX OINT: 1.0000 "application " | TOPICAL_OINTMENT | Freq: Two times a day (BID) | CUTANEOUS | 0 refills | Status: AC

## 2019-04-15 MED ORDER — HYDROCORTISONE 1 % EX CREA: TOPICAL_CREAM | CUTANEOUS | 0 refills | Status: AC

## 2019-04-15 NOTE — ED Triage Notes (Signed)
Pt c/o rash to right leg since Monday. Pt states mild itching and some soreness to knee area of rash.  States leg feels "numb".  Denies fever, chills.  Raised, red rash with individual areas noted to medial aspect of RLE at knee and proximal to knee.

## 2019-04-15 NOTE — Discharge Instructions (Signed)
When she did try wearing looser fitting clothing.  Take the ibuprofen as this may also help with the numbness and tingling.  If this is not resolving I want you to follow-up with your primary care.  If it is worsening or you notice weakness please follow-up sooner with your primary care or return to urgent care for evaluation  I want you to place triple antibiotic ointment and the hydrocortisone cream on to the rash until they resolve.  If the rash worsens or the lesions grow significantly in size please return for reevaluation

## 2019-04-15 NOTE — ED Provider Notes (Signed)
[facility]    CSN: [id number] Arrival date & time: 04/15/19  1928      History   Chief Complaint Chief Complaint  Patient presents with  . Rash    HPI Sara Mckinney is a 34 y.o. female.   Patient presents for evaluation of rash on her right leg as well as numbness and tingling in her right upper leg.  She reports rash appeared 4 to 5 days ago on the right leg.  It is the inside around her knee.  There are individual lesions.  They itch at times.  They have not drained.  They have not been painful.  She is not aware of being bitten by any mosquitoes or other bugs.  They appeared in the middle of the day.  She denies being outside and around mosquitoes.  She reports that 5 days ago during the middle of the day she had numbness and tingling on the side of her right upper leg.  Describes this is feeling like it fell asleep.  This is gradually improved.  She denies weakness in the right leg or lower extremity.  She now endorses just an odd sensation with touch of the right upper leg.  She does report wearing tight fitting leggings.  She reports that it is not significantly worse when seated.     History reviewed. No pertinent past medical history.  There are no problems to display for this patient.   Past Surgical History:  Procedure Laterality Date  . ABDOMINAL HYSTERECTOMY    . CESAREAN SECTION      OB History   No obstetric history on file.      Home Medications    Prior to Admission medications   Medication Sig Start Date End Date Taking? Authorizing Provider  acetaminophen (TYLENOL) 500 MG tablet Take 1,000 mg by mouth every 6 (six) hours as needed for mild pain.    [provider]  hydrocortisone cream 1 % Apply to affected area 2 times daily 04/15/19   Katisha Shimizu, Veryl Speak, PA-C  ibuprofen (ADVIL) 600 MG tablet Take 1 tablet (600 mg total) by mouth every 6 (six) hours as needed. 04/15/19   Eunie Lawn, Veryl Speak, PA-C  neomycin-bacitracin-polymyxin  (NEOSPORIN) ointment Apply 1 application topically every 12 (twelve) hours. 04/15/19   Venita Seng, Veryl Speak, PA-C    Family History Family History  Problem Relation Age of Onset  . Healthy Mother   . Diabetes Father     Social History Social History   Tobacco Use  . Smoking status: Current Every Day Smoker    Types: Cigarettes  . Smokeless tobacco: Never Used  Substance Use Topics  . Alcohol use: Yes  . Drug use: No     Allergies   Codeine   Review of Systems Review of Systems  Constitutional: Negative for chills and fever.  Skin: Positive for color change and rash.  Neurological: Positive for numbness. Negative for weakness.     Physical Exam Triage Vital Signs ED Triage Vitals  Enc Vitals Group     BP 04/15/19 1957 (!) 137/95     Pulse Rate 04/15/19 1957 77     Resp 04/15/19 1957 18     Temp 04/15/19 1957 98.6 F (37 C)     Temp src --      SpO2 04/15/19 1957 100 %     Weight --      Height --      Head Circumference --  Peak Flow --      Pain Score 04/15/19 1954 0     Pain Loc --      Pain Edu? --      Excl. in [place]? --    No data found.  Updated Vital Signs BP (!) 137/95 (BP Location: Right Arm)   Pulse 77   Temp 98.6 F (37 C)   Resp 18   SpO2 100%   Visual Acuity Right Eye Distance:   Left Eye Distance:   Bilateral Distance:    Right Eye Near:   Left Eye Near:    Bilateral Near:     Physical Exam   UC Treatments / Results  Labs (all labs ordered are listed, but only abnormal results are displayed) Labs Reviewed - No data to display  EKG   Radiology No results found.  Procedures Procedures (including critical care time)  Medications Ordered in UC Medications - No data to display  Initial Impression / Assessment and Plan / UC Course  I have reviewed the triage vital signs and the nursing notes.  Pertinent labs & imaging results that were available during my care of the patient were reviewed by me and considered in my  medical decision making (see chart for details).     #Rash #Paresthesia right leg Patient is 34 year old presenting with rash paresthesias of the right upper leg.  Rash differential would be bug bite versus folliculitis.  Will cover with hydrocortisone and antibiotic ointment.  Believe paresthesia to be possible meralgia paresthetica.  Instructed to wear looser clothing and trial NSAIDs.  She is generally improving.  Instructed to follow-up with primary care if this does not continue to improve or worsens.   Final Clinical Impressions(s) / UC Diagnoses   Final diagnoses:  Rash and nonspecific skin eruption  Paresthesia of right leg     Discharge Instructions     When she did try wearing looser fitting clothing.  Take the ibuprofen as this may also help with the numbness and tingling.  If this is not resolving I want you to follow-up with your primary care.  If it is worsening or you notice weakness please follow-up sooner with your primary care or return to urgent care for evaluation  I want you to place triple antibiotic ointment and the hydrocortisone cream on to the rash until they resolve.  If the rash worsens or the lesions grow significantly in size please return for reevaluation        ED Prescriptions    Medication Sig Dispense Auth. Provider   ibuprofen (ADVIL) 600 MG tablet Take 1 tablet (600 mg total) by mouth every 6 (six) hours as needed. 30 tablet Dalylah Ramey, Marguerita Beards, PA-C   neomycin-bacitracin-polymyxin (NEOSPORIN) ointment Apply 1 application topically every 12 (twelve) hours. 15 g Jashira Cotugno, Marguerita Beards, PA-C   hydrocortisone cream 1 % Apply to affected area 2 times daily 15 g Eddith Mentor, Marguerita Beards, PA-C     PDMP not reviewed this encounter.   Purnell Shoemaker, PA-C 04/15/19 2030

## 2019-09-16 ENCOUNTER — Other Ambulatory Visit: Payer: Self-pay

## 2019-09-16 ENCOUNTER — Other Ambulatory Visit: Payer: Self-pay | Admitting: Critical Care Medicine

## 2019-09-16 DIAGNOSIS — Z20822 Contact with and (suspected) exposure to covid-19: Secondary | ICD-10-CM

## 2019-09-18 LAB — SARS-COV-2, NAA 2 DAY TAT

## 2019-09-18 LAB — NOVEL CORONAVIRUS, NAA: SARS-CoV-2, NAA: DETECTED — AB
